# Patient Record
Sex: Female | Born: 1990 | Race: White | Hispanic: No | Marital: Married
Health system: Southern US, Community
[De-identification: ages and names within clinical notes are randomized; demographics above are authoritative.]

---

## 2017-05-13 ENCOUNTER — Ambulatory Visit (INDEPENDENT_AMBULATORY_CARE_PROVIDER_SITE_OTHER): Payer: Self-pay | Admitting: Pediatrics

## 2017-05-13 ENCOUNTER — Encounter: Payer: Self-pay | Admitting: Pediatrics

## 2017-05-13 DIAGNOSIS — Z7681 Expectant parent(s) prebirth pediatrician visit: Secondary | ICD-10-CM | POA: Insufficient documentation

## 2017-05-13 NOTE — Progress Notes (Signed)
Prenatal counseling for impending newborn done-- Z76.81  

## 2017-06-10 ENCOUNTER — Inpatient Hospital Stay (HOSPITAL_COMMUNITY): Admission: AD | Admit: 2017-06-10 | Payer: Self-pay | Source: Ambulatory Visit | Admitting: Obstetrics and Gynecology

## 2017-12-08 ENCOUNTER — Other Ambulatory Visit: Payer: Self-pay

## 2017-12-08 DIAGNOSIS — N6322 Unspecified lump in the left breast, upper inner quadrant: Secondary | ICD-10-CM

## 2017-12-17 ENCOUNTER — Ambulatory Visit
Admission: RE | Admit: 2017-12-17 | Discharge: 2017-12-17 | Disposition: A | Discharge status: Home / Self Care | Payer: No Typology Code available for payment source | Source: Ambulatory Visit

## 2017-12-17 DIAGNOSIS — N6322 Unspecified lump in the left breast, upper inner quadrant: Secondary | ICD-10-CM

## 2019-06-05 IMAGING — US ULTRASOUND LEFT BREAST LIMITED
1 series · 2 of 2 positions shown · non-contrast
Comparison: None

CLINICAL DATA: 27-year-old female with focal pain and thickening in
the INNER LEFT breast. Patient is currently breast feeding.

EXAM:
ULTRASOUND OF THE LEFT BREAST

[Series 1: ultrasound left breast limited · 0.06mm/px · 2 of 2 slices shown]
[im 1/2]
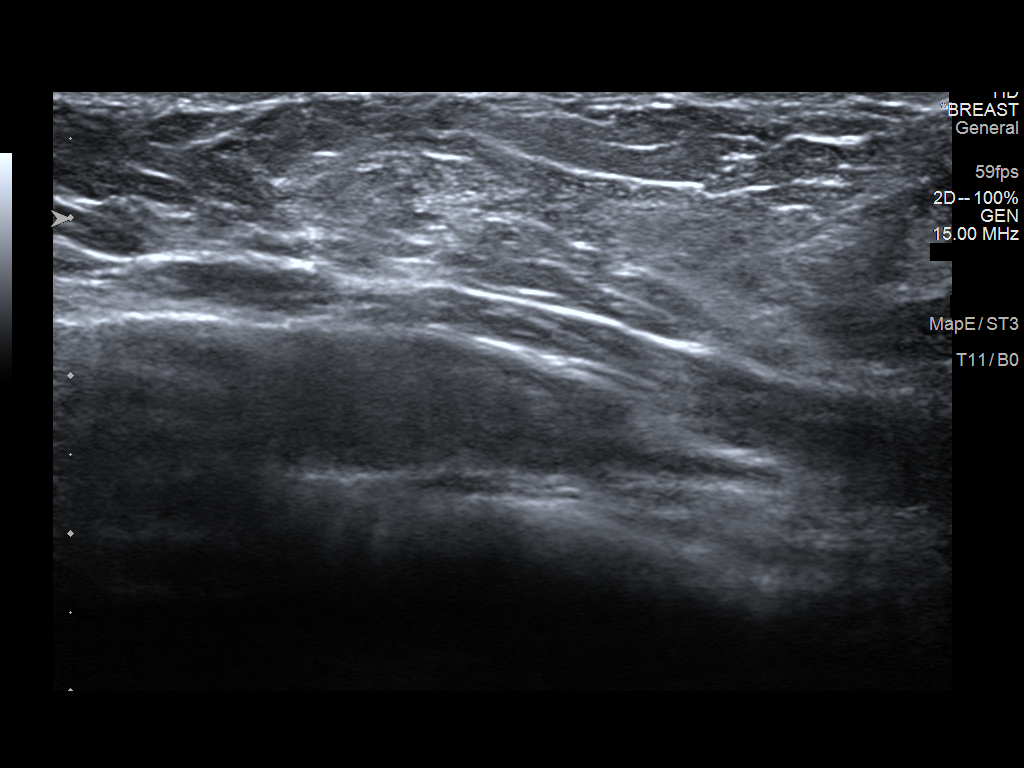
[im 2/2]
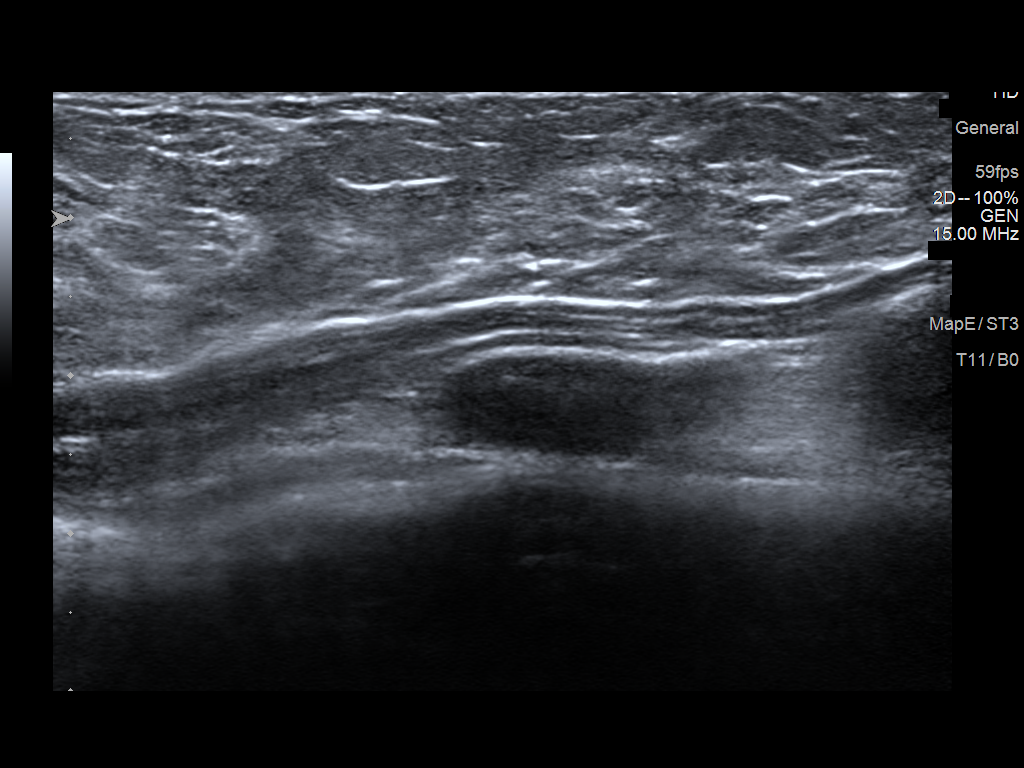

[2 of 2 positions shown; findings below may reference images not displayed]

FINDINGS: On physical exam, mild thickening in the INNER LEFT breast
identified without discrete palpable mass.

Targeted ultrasound is performed, showing no solid or cystic mass,
distortion or abnormal shadowing within the INNER LEFT breast.
IMPRESSION: No sonographic or suspicious palpable abnormality within the INNER
LEFT breast, in the area of patient's pain.

RECOMMENDATION:
Bilateral screening mammogram at age 41

I have discussed the findings, causes of breast pain and
recommendations with the patient. Results were also provided in
writing at the conclusion of the visit. If applicable, a reminder
letter will be sent to the patient regarding the next appointment.

BI-RADS CATEGORY  1: Negative.
